# Patient Record
Sex: Female | Born: 1970
Health system: Southern US, Community
[De-identification: ages and names within clinical notes are randomized; demographics above are authoritative.]

## PROBLEM LIST (undated history)

## (undated) ENCOUNTER — Emergency Department (HOSPITAL_COMMUNITY): Discharge status: Absent without leave | Payer: BLUE CROSS/BLUE SHIELD

## (undated) HISTORY — PX: SINUS SURGERY WITH INSTATRAK: SHX5215

## (undated) HISTORY — PX: NASAL SINUS SURGERY: SHX719

## (undated) HISTORY — PX: BUNIONECTOMY: SHX129

## (undated) HISTORY — PX: LASIK: SHX215

## (undated) HISTORY — PX: APPENDECTOMY: SHX54

---

## 1998-04-29 ENCOUNTER — Other Ambulatory Visit: Admission: RE | Admit: 1998-04-29 | Discharge: 1998-04-29 | Payer: Self-pay | Admitting: Obstetrics and Gynecology

## 1999-06-27 ENCOUNTER — Other Ambulatory Visit: Admission: RE | Admit: 1999-06-27 | Discharge: 1999-06-27 | Payer: Self-pay | Admitting: Obstetrics and Gynecology

## 2000-09-19 ENCOUNTER — Other Ambulatory Visit: Admission: RE | Admit: 2000-09-19 | Discharge: 2000-09-19 | Payer: Self-pay | Admitting: Obstetrics and Gynecology

## 2001-12-18 ENCOUNTER — Other Ambulatory Visit: Admission: RE | Admit: 2001-12-18 | Discharge: 2001-12-18 | Payer: Self-pay | Admitting: Obstetrics and Gynecology

## 2002-09-01 ENCOUNTER — Encounter: Admission: RE | Admit: 2002-09-01 | Discharge: 2002-09-11 | Payer: Self-pay | Admitting: Family Medicine

## 2003-04-27 ENCOUNTER — Other Ambulatory Visit: Admission: RE | Admit: 2003-04-27 | Discharge: 2003-04-27 | Payer: Self-pay | Admitting: Obstetrics and Gynecology

## 2004-05-30 ENCOUNTER — Other Ambulatory Visit: Admission: RE | Admit: 2004-05-30 | Discharge: 2004-05-30 | Payer: Self-pay | Admitting: Obstetrics and Gynecology

## 2005-07-24 ENCOUNTER — Other Ambulatory Visit: Admission: RE | Admit: 2005-07-24 | Discharge: 2005-07-24 | Payer: Self-pay | Admitting: Obstetrics and Gynecology

## 2007-01-08 ENCOUNTER — Encounter: Admission: RE | Admit: 2007-01-08 | Discharge: 2007-01-08 | Payer: Self-pay | Admitting: Family Medicine

## 2013-08-17 ENCOUNTER — Other Ambulatory Visit: Payer: Self-pay | Admitting: Obstetrics and Gynecology

## 2013-08-17 DIAGNOSIS — R928 Other abnormal and inconclusive findings on diagnostic imaging of breast: Secondary | ICD-10-CM

## 2013-08-31 ENCOUNTER — Ambulatory Visit
Admission: RE | Admit: 2013-08-31 | Discharge: 2013-08-31 | Disposition: A | Discharge status: Home / Self Care | Payer: BC Managed Care – PPO | Source: Ambulatory Visit | Attending: Obstetrics and Gynecology | Admitting: Obstetrics and Gynecology

## 2013-08-31 DIAGNOSIS — R928 Other abnormal and inconclusive findings on diagnostic imaging of breast: Secondary | ICD-10-CM

## 2013-09-01 ENCOUNTER — Emergency Department
Admission: EM | Admit: 2013-09-01 | Discharge: 2013-09-01 | Disposition: A | Discharge status: Home / Self Care | Payer: BC Managed Care – PPO | Source: Home / Self Care | Attending: Family Medicine | Admitting: Family Medicine

## 2013-09-01 ENCOUNTER — Emergency Department (INDEPENDENT_AMBULATORY_CARE_PROVIDER_SITE_OTHER): Payer: BC Managed Care – PPO

## 2013-09-01 ENCOUNTER — Other Ambulatory Visit: Payer: BC Managed Care – HMO

## 2013-09-01 ENCOUNTER — Encounter: Payer: Self-pay | Admitting: Emergency Medicine

## 2013-09-01 DIAGNOSIS — S62619A Displaced fracture of proximal phalanx of unspecified finger, initial encounter for closed fracture: Secondary | ICD-10-CM

## 2013-09-01 DIAGNOSIS — IMO0002 Reserved for concepts with insufficient information to code with codable children: Secondary | ICD-10-CM

## 2013-09-01 DIAGNOSIS — S62639A Displaced fracture of distal phalanx of unspecified finger, initial encounter for closed fracture: Secondary | ICD-10-CM

## 2013-09-01 DIAGNOSIS — W230XXA Caught, crushed, jammed, or pinched between moving objects, initial encounter: Secondary | ICD-10-CM

## 2013-09-01 MED ORDER — TRAMADOL-ACETAMINOPHEN 37.5-325 MG PO TABS: 1.0000 | ORAL_TABLET | Freq: Four times a day (QID) | ORAL | Status: DC | PRN

## 2013-09-01 NOTE — ED Notes (Signed)
Pt c/o RT 4th finger injury x today, after getting it caught in her dogs collar.

## 2013-09-01 NOTE — ED Provider Notes (Signed)
CSN: [id number]     Arrival date & time 09/01/13  1859 History   First MD Initiated Contact with Patient 09/01/13 1905     Chief Complaint  Patient presents with  . Finger Injury    HPI  r 4th finger pain x 1 day  Pt got hier finger caught in her dog's collar.  Finger dislocated and pt reduced at time of incident.  Has had mild pain since this point.  Minimal to mild swelling Full ROM  Neurovascularly intact.   History reviewed. No pertinent past medical history. Past Surgical History  Procedure Laterality Date  . Appendectomy    . Nasal sinus surgery    . Bunionectomy    . Lasik     History reviewed. No pertinent family history. History  Substance Use Topics  . Smoking status: Never Smoker   . Smokeless tobacco: Not on file  . Alcohol Use: Yes   OB History   Grav Para Term Preterm Abortions TAB SAB Ect Mult Living                 Review of Systems  All other systems reviewed and are negative.    Allergies  Review of patient's allergies indicates no known allergies.  Home Medications   Current Outpatient Rx  Name  Route  Sig  Dispense  Refill  . Azelastine HCl (ASTEPRO) 0.15 % SOLN   Nasal   Place into the nose.         Marland Kitchen Fluticasone-Salmeterol (ADVAIR) 100-50 MCG/DOSE AEPB   Inhalation   Inhale 1 puff into the lungs every 12 (twelve) hours.         . traMADol-acetaminophen (ULTRACET) 37.5-325 MG per tablet   Oral   Take 1 tablet by mouth every 6 (six) hours as needed for pain.   30 tablet   0    BP 126/87  Pulse 78  Temp(Src) 98.2 F (36.8 C) (Oral)  Resp 16  Ht 5\' 4"  (1.626 m)  Wt 146 lb (66.225 kg)  BMI 25.05 kg/m2  SpO2 99%  LMP 08/29/2013 Physical Exam  Constitutional: She appears well-developed and well-nourished.  HENT:  Head: Normocephalic and atraumatic.  Eyes: Conjunctivae are normal. Pupils are equal, round, and reactive to light.  Neck: Normal range of motion. Neck supple.  Cardiovascular: Normal rate and regular rhythm.    Pulmonary/Chest: Effort normal and breath sounds normal.  Abdominal: Soft.  Musculoskeletal:       Hands: Neurological: She is alert.  Skin: Skin is warm.    ED Course  Procedures (including critical care time) Labs Review Labs Reviewed - No data to display Imaging Review Dg Finger Ring Right  09/01/2013   CLINICAL DATA:  Pain post trauma  EXAM: RIGHT 4TH FINGER 2+V  COMPARISON:  None.  FINDINGS: Frontal, oblique, and lateral views were obtained. There is a fracture along the medial distal aspect of the 4th proximal phalanx with medial displacement of the the fracture fragment compared to the remainder the bone. Fracture extends into the 4th PIP joint with soft tissue swelling in this area. No other fracture. No dislocation. The other joints appear normal.  IMPRESSION: Displaced fracture along the medial distal aspect of the 4th proximal phalanx.   Electronically Signed   By: Bretta Bang M.D.   On: 09/01/2013 19:35      MDM   1. Proximal phalanx fracture of finger, closed, initial encounter    Extended splint with buddy taped placed.  Ultracet for pain  control.  Discussed case with sports medicine over the phone.  Plan for follow up in am for general reeval Discussed general care and MSK red flags.  Follow up with sports medicine in am.     The patient and/or caregiver has been counseled thoroughly with regard to treatment plan and/or medications prescribed including dosage, schedule, interactions, rationale for use, and possible side effects and they verbalize understanding. Diagnoses and expected course of recovery discussed and will return if not improved as expected or if the condition worsens. Patient and/or caregiver verbalized understanding.         Doree Albee, MD 09/01/13 [phone]

## 2013-09-02 ENCOUNTER — Encounter: Payer: Self-pay | Admitting: Sports Medicine

## 2013-09-02 ENCOUNTER — Ambulatory Visit (INDEPENDENT_AMBULATORY_CARE_PROVIDER_SITE_OTHER): Payer: BC Managed Care – PPO | Admitting: Sports Medicine

## 2013-09-02 VITALS — BP 105/63 | HR 68 | Wt 146.0 lb

## 2013-09-02 DIAGNOSIS — S62614A Displaced fracture of proximal phalanx of right ring finger, initial encounter for closed fracture: Secondary | ICD-10-CM | POA: Insufficient documentation

## 2013-09-02 DIAGNOSIS — IMO0002 Reserved for concepts with insufficient information to code with codable children: Secondary | ICD-10-CM

## 2013-09-02 DIAGNOSIS — W540XXA Bitten by dog, initial encounter: Secondary | ICD-10-CM

## 2013-09-02 NOTE — Assessment & Plan Note (Addendum)
Fracture extends into the PIP joint. We did discuss the risk of future osteoarthritis. Splint replaced. Tylenol is all she has needed for pain, calcium and vitamin D supplement twice a day. Return in one week to repeat x-rays.  I billed a fracture code for this visit, all subsequent visits for this complaint will be "post-op checks" in the global period.

## 2013-09-02 NOTE — Progress Notes (Addendum)
   Subjective:    I'm seeing this patient as a consultation for:  Dr. Alvester Morin  CC: Finger fracture  HPI: This is a very pleasant 49 female, unfortunately she was walking her dog yesterday when it wrapped the leash around her finger, ran, snapping her finger. She tells me that it appeared deformed, and she self reduced the fracture. She went to urgent care where x-rays were done that showed a fracture through the distal proximal phalanx of the fourth ring finger of the right hand. She was placed in a static splint and referred to me for further evaluation and definitive treatment. Pain is only mild, and she really hasn't had to use anything other than Tylenol. Localized, persistent.  Past medical history, Surgical history, Family history not pertinant except as noted below, Social history, Allergies, and medications have been entered into the medical record, reviewed, and no changes needed.   Review of Systems: No headache, visual changes, nausea, vomiting, diarrhea, constipation, dizziness, abdominal pain, skin rash, fevers, chills, night sweats, weight loss, swollen lymph nodes, body aches, joint swelling, muscle aches, chest pain, shortness of breath, mood changes, visual or auditory hallucinations.   Objective:   General: Well Developed, well nourished, and in no acute distress.  Neuro/Psych: Alert and oriented x3, extra-ocular muscles intact, able to move all 4 extremities, sensation grossly intact. Skin: Warm and dry, no rashes noted.  Respiratory: Not using accessory muscles, speaking in full sentences, trachea midline.  Cardiovascular: Pulses palpable, no extremity edema. Abdomen: Does not appear distended. Right hand: Splint is removed, there is some swelling and bruising with tenderness to palpation over the distal fourth proximal phalanx, she has excellent motion with full flexion and extension, there is some scissoring of the fifth digit under the fourth digit but this is similar on  the contralateral side. She is neurovascularly intact distally.  X-rays were personally reviewed, there is a fracture through the medial distal condyle of the right fourth proximal phalanx. This fracture extends into the proximal interphalangeal joint, but does not create much joint incongruity.  I recreated the static extension splint.  Impression and Recommendations:   This case required medical decision making of moderate complexity.

## 2013-09-08 ENCOUNTER — Ambulatory Visit: Admission: RE | Admit: 2013-09-08 | Payer: BC Managed Care – PPO | Source: Ambulatory Visit

## 2013-09-08 ENCOUNTER — Ambulatory Visit
Admission: RE | Admit: 2013-09-08 | Discharge: 2013-09-08 | Disposition: A | Discharge status: Home / Self Care | Payer: BC Managed Care – PPO | Source: Ambulatory Visit | Attending: Obstetrics and Gynecology | Admitting: Obstetrics and Gynecology

## 2013-09-08 IMAGING — MG MM DIAGNOSTIC UNILATERAL R
2 series · 2 of 2 positions shown · non-contrast
Comparison: [DATE], [DATE], [DATE], [DATE] from
[HOSPITAL] OB/GYN

CLINICAL DATA: The patient returns for evaluation of a possible
area of architectural distortion in the outer portion of the right
breast noted on screening study from [HOSPITAL] OB/GYN dated
[DATE].

DIGITAL DIAGNOSTIC RIGHT MAMMOGRAM

[R CC]
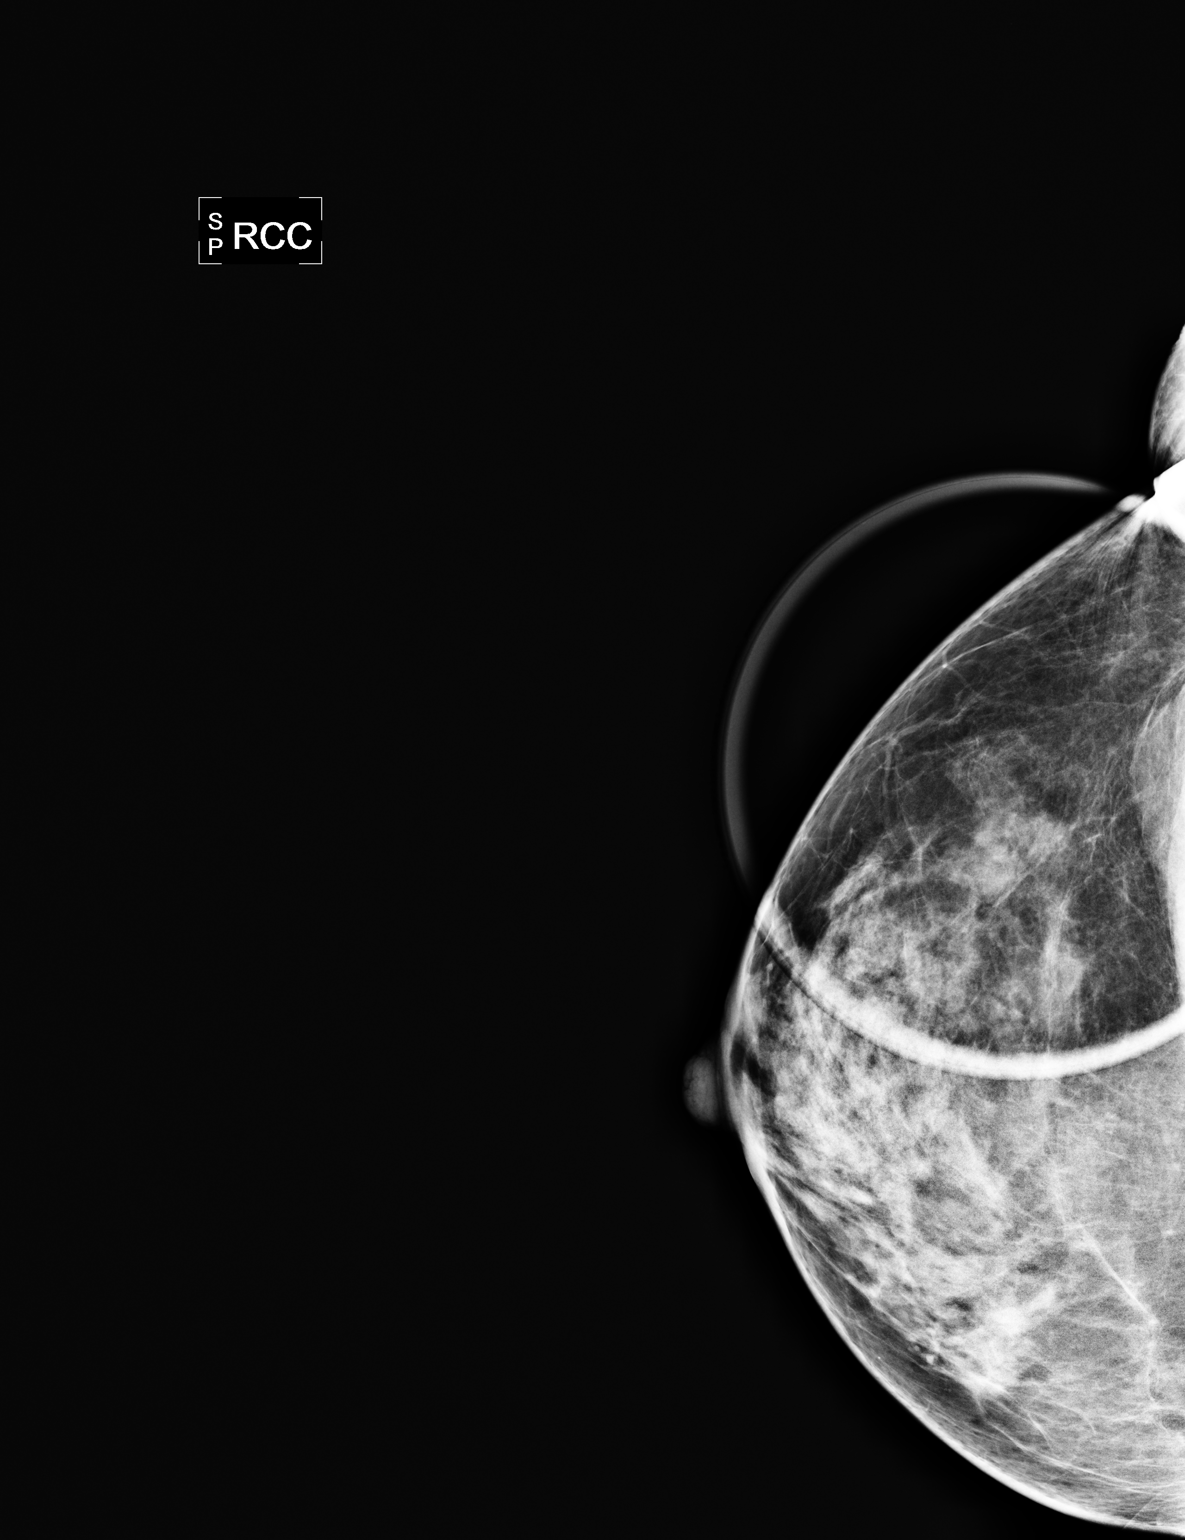

[R MLO]
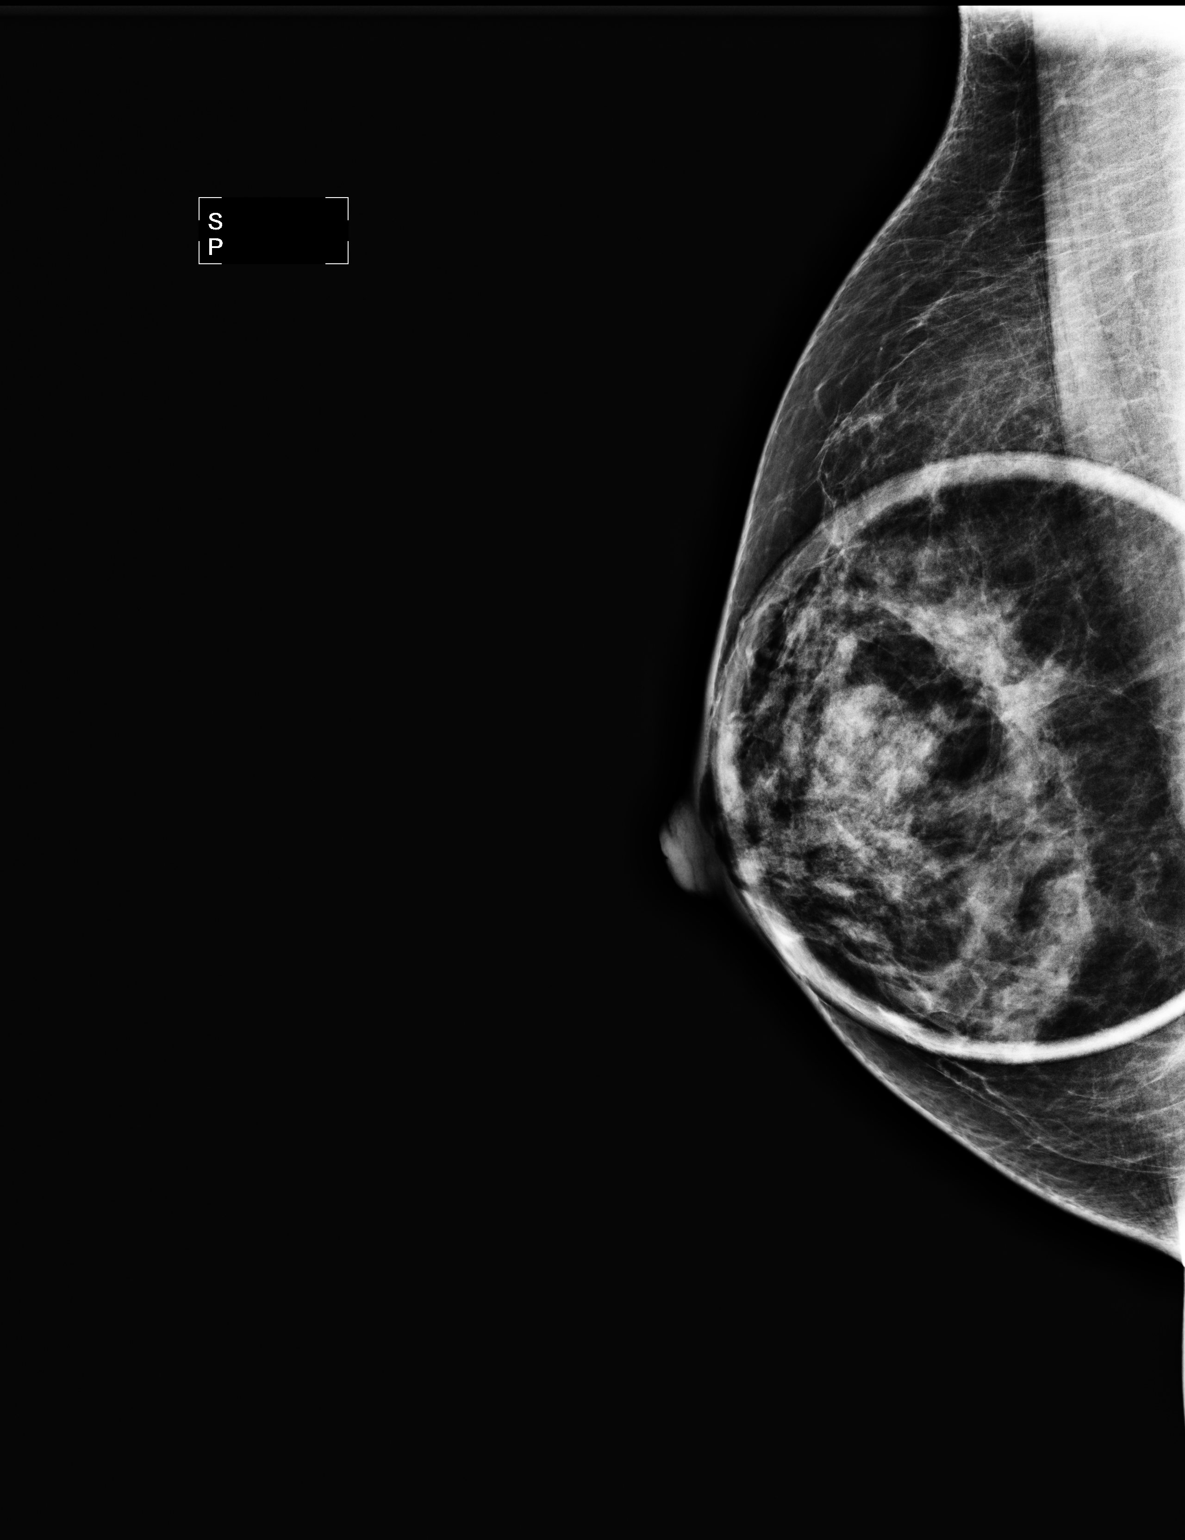

[2 of 2 positions shown; findings below may reference images not displayed]

FINDINGS: ACR Breast Density Category c:  The breast tissue is
heterogeneously dense, which may obscure small masses.

Additional views demonstrate no persistent worrisome mass or
distortion in the outer portion of the right breast.
IMPRESSION: No persistent worrisome abnormality in the outer portion of the
right breast.

RECOMMENDATION:
Yearly screening mammography is suggested.

I have discussed the findings and recommendations with the patient.
Results were also provided in writing at the conclusion of the
visit.  If applicable, a reminder letter will be sent to the
patient regarding her next appointment.

BI-RADS CATEGORY 1:  Negative.

## 2013-09-09 ENCOUNTER — Ambulatory Visit (INDEPENDENT_AMBULATORY_CARE_PROVIDER_SITE_OTHER): Payer: BC Managed Care – PPO

## 2013-09-09 ENCOUNTER — Encounter: Payer: Self-pay | Admitting: Sports Medicine

## 2013-09-09 ENCOUNTER — Ambulatory Visit (INDEPENDENT_AMBULATORY_CARE_PROVIDER_SITE_OTHER): Payer: BC Managed Care – PPO | Admitting: Sports Medicine

## 2013-09-09 VITALS — BP 120/79 | HR 69 | Wt 147.0 lb

## 2013-09-09 DIAGNOSIS — IMO0001 Reserved for inherently not codable concepts without codable children: Secondary | ICD-10-CM

## 2013-09-09 DIAGNOSIS — S62614A Displaced fracture of proximal phalanx of right ring finger, initial encounter for closed fracture: Secondary | ICD-10-CM

## 2013-09-09 DIAGNOSIS — S62614D Displaced fracture of proximal phalanx of right ring finger, subsequent encounter for fracture with routine healing: Secondary | ICD-10-CM

## 2013-09-09 NOTE — Assessment & Plan Note (Signed)
Doing well. Return in 2 weeks, we will probably get her out of the splint and and to buddy taping at that time, and working on her range of motion.

## 2013-09-09 NOTE — Progress Notes (Signed)
  Subjective: One week status post fracture of the distal end of the right fourth proximal phalanx. Doing well, very little pain.   Objective: General: Well-developed, well-nourished, and in no acute distress. Splint is removed, she has good strength to flexion and extension at the metacarpophalangeal, PIP, and DIP joints.  Static splint reapplied.  X-rays look good.  Assessment/plan:

## 2013-09-23 ENCOUNTER — Encounter: Payer: Self-pay | Admitting: Sports Medicine

## 2013-09-23 ENCOUNTER — Ambulatory Visit (INDEPENDENT_AMBULATORY_CARE_PROVIDER_SITE_OTHER): Payer: BC Managed Care – PPO | Admitting: Sports Medicine

## 2013-09-23 VITALS — BP 107/67 | HR 84 | Wt 148.0 lb

## 2013-09-23 DIAGNOSIS — S62614D Displaced fracture of proximal phalanx of right ring finger, subsequent encounter for fracture with routine healing: Secondary | ICD-10-CM

## 2013-09-23 DIAGNOSIS — IMO0002 Reserved for concepts with insufficient information to code with codable children: Secondary | ICD-10-CM

## 2013-09-23 NOTE — Assessment & Plan Note (Signed)
Out of static splint, third and fourth fingers were buddy taped together, and I have recommended and encouraged passive range of motion.

## 2013-09-23 NOTE — Progress Notes (Signed)
  Subjective: Three-week status post fracture of the right fourth proximal phalanx. Doing well with static splint.   Objective: General: Well-developed, well-nourished, and in no acute distress. Splint is removed, there is still tenderness to palpation over the fracture site, she does have only approximately 20 of range of motion. Neurovascularly intact distally.  Fingers were buddy taped together.  Assessment/plan:

## 2013-09-28 ENCOUNTER — Encounter: Payer: Self-pay | Admitting: Sports Medicine

## 2013-09-28 ENCOUNTER — Ambulatory Visit (INDEPENDENT_AMBULATORY_CARE_PROVIDER_SITE_OTHER): Payer: BC Managed Care – PPO | Admitting: Sports Medicine

## 2013-09-28 ENCOUNTER — Ambulatory Visit (INDEPENDENT_AMBULATORY_CARE_PROVIDER_SITE_OTHER): Payer: BC Managed Care – PPO

## 2013-09-28 VITALS — BP 111/70 | HR 84 | Wt 149.0 lb

## 2013-09-28 DIAGNOSIS — S62614G Displaced fracture of proximal phalanx of right ring finger, subsequent encounter for fracture with delayed healing: Secondary | ICD-10-CM

## 2013-09-28 DIAGNOSIS — IMO0001 Reserved for inherently not codable concepts without codable children: Secondary | ICD-10-CM

## 2013-09-28 DIAGNOSIS — IMO0002 Reserved for concepts with insufficient information to code with codable children: Secondary | ICD-10-CM

## 2013-09-28 MED ORDER — HYDROCODONE-ACETAMINOPHEN 5-325 MG PO TABS: 1.0000 | ORAL_TABLET | Freq: Three times a day (TID) | ORAL | Status: DC | PRN

## 2013-09-28 NOTE — Progress Notes (Addendum)
  Subjective: This pleasant 42 year old female is now 4 weeks outside of her fracture of her right fourth proximal phalanx. She was doing very well and we have transitioned her into gentle range of motion exercises unfortunately she bumped her finger into the gearshift run her car. She had immediate pain, swelling.   Objective: General: Well-developed, well-nourished, and in no acute distress. Finger does appear swollen, very tender to palpation at the fracture site, I am unable to fully extend the finger and she lacks approximately 5 of extension but she does have active extension and flexion strength, collaterals are all stable.  X-rays were reviewed and does not show significant change in the position of the fracture, there is some sign of healing.  Fingers were buddy taped together. Assessment/plan:

## 2013-09-28 NOTE — Assessment & Plan Note (Signed)
Was doing well, unfortunately banged her finger against a gearshift. Now is unable to fully extend at the proximal interphalangeal joint, and has worse swelling and pain. I am concerned for developing boutonniere's deformity. Fingers were buddy taped again, and I'm going to re-x-ray them.

## 2013-10-05 ENCOUNTER — Encounter: Payer: Self-pay | Admitting: Sports Medicine

## 2013-10-05 ENCOUNTER — Ambulatory Visit (INDEPENDENT_AMBULATORY_CARE_PROVIDER_SITE_OTHER): Payer: BC Managed Care – PPO | Admitting: Sports Medicine

## 2013-10-05 VITALS — BP 117/78 | HR 86 | Wt 148.0 lb

## 2013-10-05 DIAGNOSIS — IMO0001 Reserved for inherently not codable concepts without codable children: Secondary | ICD-10-CM

## 2013-10-05 DIAGNOSIS — S62614G Displaced fracture of proximal phalanx of right ring finger, subsequent encounter for fracture with delayed healing: Secondary | ICD-10-CM

## 2013-10-05 NOTE — Progress Notes (Signed)
  Subjective: Bonnie Yoder is now approximately 4 weeks after fracture of the distal aspect of her proximal phalanx on the right ring finger. She was doing well until we got her out of immobilization, she then bumped her finger against the gear shifter of her car, and was unable to straighten her finger route after that at the PIP joint. I saw her last week, obtained x-rays which were essentially unchanged, and place her at further remobilization. She returns today still unable to fully extend her right ring finger at the proximal interphalangeal joint.   Objective: General: Well-developed, well-nourished, and in no acute distress. Right ring finger is still slightly swollen, unable to completely extend either passively or actively at the proximal interphalangeal joint. Collateral ligaments are stable.  Assessment/plan:

## 2013-10-05 NOTE — Assessment & Plan Note (Signed)
This was a proceding very well for the first 3 weeks after fracture, unfortunately there was a reinjury, and now at week 4, I am unable to fully extend the finger at the proximal interphalangeal joint. This does raise the concern for early central slip injury with boutonniere deformity. At this point I would like her to see Dr. Janee Morn with hand surgery.

## 2013-10-07 ENCOUNTER — Telehealth: Payer: Self-pay

## 2013-10-07 NOTE — Telephone Encounter (Signed)
Error

## 2013-10-14 ENCOUNTER — Ambulatory Visit: Payer: BC Managed Care – PPO | Admitting: Sports Medicine

## 2015-06-14 ENCOUNTER — Ambulatory Visit
Admission: RE | Admit: 2015-06-14 | Discharge: 2015-06-14 | Disposition: A | Discharge status: Home / Self Care | Payer: BLUE CROSS/BLUE SHIELD | Source: Ambulatory Visit | Attending: Family Medicine | Admitting: Family Medicine

## 2015-06-14 ENCOUNTER — Other Ambulatory Visit: Payer: Self-pay | Admitting: Family Medicine

## 2015-06-14 DIAGNOSIS — M542 Cervicalgia: Secondary | ICD-10-CM

## 2015-11-29 ENCOUNTER — Other Ambulatory Visit: Payer: Self-pay | Admitting: Family Medicine

## 2015-11-29 ENCOUNTER — Ambulatory Visit
Admission: RE | Admit: 2015-11-29 | Discharge: 2015-11-29 | Disposition: A | Discharge status: Home / Self Care | Payer: BLUE CROSS/BLUE SHIELD | Source: Ambulatory Visit | Attending: Family Medicine | Admitting: Family Medicine

## 2015-11-29 DIAGNOSIS — T1490XA Injury, unspecified, initial encounter: Secondary | ICD-10-CM

## 2016-06-11 DIAGNOSIS — K529 Noninfective gastroenteritis and colitis, unspecified: Secondary | ICD-10-CM | POA: Diagnosis not present

## 2016-06-12 DIAGNOSIS — K529 Noninfective gastroenteritis and colitis, unspecified: Secondary | ICD-10-CM | POA: Diagnosis not present

## 2016-06-21 DIAGNOSIS — L719 Rosacea, unspecified: Secondary | ICD-10-CM | POA: Diagnosis not present

## 2016-06-21 DIAGNOSIS — J309 Allergic rhinitis, unspecified: Secondary | ICD-10-CM | POA: Diagnosis not present

## 2016-06-21 DIAGNOSIS — R152 Fecal urgency: Secondary | ICD-10-CM | POA: Diagnosis not present

## 2016-06-21 DIAGNOSIS — J45909 Unspecified asthma, uncomplicated: Secondary | ICD-10-CM | POA: Diagnosis not present

## 2016-06-21 DIAGNOSIS — Z0000 Encounter for general adult medical examination without abnormal findings: Secondary | ICD-10-CM | POA: Diagnosis not present

## 2016-06-22 DIAGNOSIS — R197 Diarrhea, unspecified: Secondary | ICD-10-CM | POA: Diagnosis not present

## 2016-06-22 DIAGNOSIS — R109 Unspecified abdominal pain: Secondary | ICD-10-CM | POA: Diagnosis not present

## 2016-06-22 DIAGNOSIS — Z800 Family history of malignant neoplasm of digestive organs: Secondary | ICD-10-CM | POA: Diagnosis not present

## 2016-06-27 DIAGNOSIS — R197 Diarrhea, unspecified: Secondary | ICD-10-CM | POA: Diagnosis not present

## 2016-08-31 DIAGNOSIS — Z23 Encounter for immunization: Secondary | ICD-10-CM | POA: Diagnosis not present

## 2016-11-18 ENCOUNTER — Encounter: Payer: Self-pay | Admitting: Emergency Medicine

## 2016-11-18 ENCOUNTER — Emergency Department
Admission: EM | Admit: 2016-11-18 | Discharge: 2016-11-18 | Disposition: A | Discharge status: Home / Self Care | Payer: BLUE CROSS/BLUE SHIELD | Source: Home / Self Care | Attending: Family Medicine | Admitting: Family Medicine

## 2016-11-18 DIAGNOSIS — R1032 Left lower quadrant pain: Secondary | ICD-10-CM

## 2016-11-18 DIAGNOSIS — R112 Nausea with vomiting, unspecified: Secondary | ICD-10-CM

## 2016-11-18 MED ORDER — METRONIDAZOLE 500 MG PO TABS: 500.0000 mg | ORAL_TABLET | Freq: Three times a day (TID) | ORAL | 0 refills | Status: DC

## 2016-11-18 MED ORDER — PROMETHAZINE HCL 25 MG PO TABS: 25.0000 mg | ORAL_TABLET | Freq: Four times a day (QID) | ORAL | 0 refills | Status: DC | PRN

## 2016-11-18 MED ORDER — CIPROFLOXACIN HCL 500 MG PO TABS
500.0000 mg | ORAL_TABLET | Freq: Two times a day (BID) | ORAL | 0 refills | Status: DC
Start: 2016-11-18 — End: 2019-12-31

## 2016-11-18 NOTE — ED Triage Notes (Signed)
Pt c/o left lower quadrant pain, low grade fever, nausea and vomitting.  Hadn't really eaten anything out the norm.

## 2016-11-18 NOTE — ED Provider Notes (Signed)
CSN: [id number]     Arrival date & time 11/18/16  1214 History   First MD Initiated Contact with Patient 11/18/16 1315     Chief Complaint  Patient presents with  . Abdominal Pain   (Consider location/radiation/quality/duration/timing/severity/associated sxs/prior Treatment) HPI  Bonnie Yoder is a 46 y.o. female presenting to UC with c/o LLQ abdominal pain with low grade fever, nausea and vomiting that started this morning.  She reports mild bloating yesterday. No diarrhea. No sick contacts. Denies eating anything that may have caused symptoms. Denies hx of known diverticulitis.  Denies urinary symptoms. No medications tried PTA.  Hx of appendectomy.  History reviewed. No pertinent past medical history. Past Surgical History:  Procedure Laterality Date  . APPENDECTOMY    . BUNIONECTOMY    . BUNIONECTOMY    . LASIK    . NASAL SINUS SURGERY    . SINUS SURGERY WITH INSTATRAK     Family History  Problem Relation Age of Onset  . Cancer Mother   . Heart disease Father    Social History  Substance Use Topics  . Smoking status: Never Smoker  . Smokeless tobacco: Never Used  . Alcohol use Yes   OB History    No data available     Review of Systems  Constitutional: Positive for fever. Negative for appetite change and chills.  Respiratory: Negative for cough, chest tightness and shortness of breath.   Cardiovascular: Negative for chest pain and palpitations.  Gastrointestinal: Positive for abdominal pain ( LLQ), nausea and vomiting. Negative for blood in stool and diarrhea.  Genitourinary: Negative for dysuria, flank pain, frequency, pelvic pain, urgency, vaginal bleeding and vaginal discharge.  Musculoskeletal: Negative for back pain and myalgias.    Allergies  Patient has no known allergies.  Home Medications   Prior to Admission medications   Medication Sig Start Date End Date Taking? Authorizing Provider  Ibuprofen (ADVIL PO) Take by mouth.   Yes Historical Provider,  MD  Azelastine HCl (ASTEPRO) 0.15 % SOLN Place into the nose.    Historical Provider, MD  ciprofloxacin (CIPRO) 500 MG tablet Take 1 tablet (500 mg total) by mouth 2 (two) times daily. x 7 days 11/18/16   Junius FinnerErin O'Malley, PA-C  Fluticasone-Salmeterol (ADVAIR) 100-50 MCG/DOSE AEPB Inhale 1 puff into the lungs every 12 (twelve) hours.    Historical Provider, MD  metroNIDAZOLE (FLAGYL) 500 MG tablet Take 1 tablet (500 mg total) by mouth 3 (three) times daily. One po bid x 7 days 11/18/16   Junius FinnerErin O'Malley, PA-C  promethazine (PHENERGAN) 25 MG tablet Take 1 tablet (25 mg total) by mouth every 6 (six) hours as needed for nausea or vomiting. 11/18/16   Junius FinnerErin O'Malley, PA-C   Meds Ordered and Administered this Visit  Medications - No data to display  BP 112/76 (BP Location: Left Arm)   Pulse 85   Temp 98.8 F (37.1 C) (Oral)   Ht 5\' 4"  (1.626 m)   Wt 158 lb 8 oz (71.9 kg)   LMP 10/19/2016 (Exact Date)   SpO2 100%   BMI 27.21 kg/m  No data found.   Physical Exam  Constitutional: She is oriented to person, place, and time. She appears well-developed and well-nourished. No distress.  HENT:  Head: Normocephalic and atraumatic.  Mouth/Throat: Oropharynx is clear and moist.  Eyes: EOM are normal.  Neck: Normal range of motion.  Cardiovascular: Normal rate and regular rhythm.   Pulmonary/Chest: Effort normal and breath sounds normal. No respiratory distress. She has  no wheezes. She has no rales.  Abdominal: Soft. She exhibits no distension and no mass. There is tenderness in the left lower quadrant. There is no rigidity, no rebound, no guarding and no CVA tenderness.  Musculoskeletal: Normal range of motion.  Neurological: She is alert and oriented to person, place, and time.  Skin: Skin is warm and dry. She is not diaphoretic.  Psychiatric: She has a normal mood and affect. Her behavior is normal.  Nursing note and vitals reviewed.   Urgent Care Course   Clinical Course     Procedures  (including critical care time)  Labs Review Labs Reviewed - No data to display  Imaging Review No results found.   MDM   1. Abdominal pain, acute, left lower quadrant   2. Non-intractable vomiting with nausea, unspecified vomiting type    Low grade fever, nausea, vomiting and LLQ abdominal pain with mild tenderness on exam.  Exam not concerning for surgical abdomen. Clinical concern for diverticulitis. Advised pt cannot confirm diagnosis w/o CT scan, would have to send pt to [facility].  Pt appears well, afebrile at this time, able to keep down fluids, offered empiric treatment. Pt agreeable to try empiric treatment first.  Strongly advised to call PCP this week for recheck of symptoms and for likely colonoscopy f/u.  Rx: Cipro, flagyl, phenergan Discussed symptoms that warrant emergent care in the ED. Patient verbalized understanding and agreement with treatment plan.    Junius Finner, PA-C 11/18/16 1443

## 2016-11-23 ENCOUNTER — Telehealth: Payer: Self-pay | Admitting: Emergency Medicine

## 2016-11-23 NOTE — Telephone Encounter (Signed)
Inquired about patient's status; encourage them to call with questions/concerns.  

## 2016-12-06 DIAGNOSIS — Z124 Encounter for screening for malignant neoplasm of cervix: Secondary | ICD-10-CM | POA: Diagnosis not present

## 2016-12-06 DIAGNOSIS — Z6826 Body mass index (BMI) 26.0-26.9, adult: Secondary | ICD-10-CM | POA: Diagnosis not present

## 2016-12-06 DIAGNOSIS — Z01419 Encounter for gynecological examination (general) (routine) without abnormal findings: Secondary | ICD-10-CM | POA: Diagnosis not present

## 2016-12-06 DIAGNOSIS — Z1231 Encounter for screening mammogram for malignant neoplasm of breast: Secondary | ICD-10-CM | POA: Diagnosis not present

## 2016-12-07 ENCOUNTER — Other Ambulatory Visit: Payer: Self-pay | Admitting: Obstetrics

## 2016-12-07 DIAGNOSIS — R928 Other abnormal and inconclusive findings on diagnostic imaging of breast: Secondary | ICD-10-CM

## 2016-12-12 ENCOUNTER — Ambulatory Visit
Admission: RE | Admit: 2016-12-12 | Discharge: 2016-12-12 | Disposition: A | Discharge status: Home / Self Care | Payer: BLUE CROSS/BLUE SHIELD | Source: Ambulatory Visit | Attending: Obstetrics | Admitting: Obstetrics

## 2016-12-12 DIAGNOSIS — N6489 Other specified disorders of breast: Secondary | ICD-10-CM | POA: Diagnosis not present

## 2016-12-12 DIAGNOSIS — R928 Other abnormal and inconclusive findings on diagnostic imaging of breast: Secondary | ICD-10-CM

## 2016-12-12 DIAGNOSIS — R922 Inconclusive mammogram: Secondary | ICD-10-CM | POA: Diagnosis not present

## 2017-08-15 DIAGNOSIS — Z13220 Encounter for screening for lipoid disorders: Secondary | ICD-10-CM | POA: Diagnosis not present

## 2017-08-15 DIAGNOSIS — Z131 Encounter for screening for diabetes mellitus: Secondary | ICD-10-CM | POA: Diagnosis not present

## 2017-08-15 DIAGNOSIS — Z0000 Encounter for general adult medical examination without abnormal findings: Secondary | ICD-10-CM | POA: Diagnosis not present

## 2017-12-11 DIAGNOSIS — N898 Other specified noninflammatory disorders of vagina: Secondary | ICD-10-CM | POA: Diagnosis not present

## 2017-12-11 DIAGNOSIS — Z1231 Encounter for screening mammogram for malignant neoplasm of breast: Secondary | ICD-10-CM | POA: Diagnosis not present

## 2017-12-11 DIAGNOSIS — Z01419 Encounter for gynecological examination (general) (routine) without abnormal findings: Secondary | ICD-10-CM | POA: Diagnosis not present

## 2017-12-11 DIAGNOSIS — Z6826 Body mass index (BMI) 26.0-26.9, adult: Secondary | ICD-10-CM | POA: Diagnosis not present

## 2017-12-12 ENCOUNTER — Other Ambulatory Visit: Payer: Self-pay | Admitting: Obstetrics

## 2017-12-12 DIAGNOSIS — R928 Other abnormal and inconclusive findings on diagnostic imaging of breast: Secondary | ICD-10-CM

## 2017-12-19 ENCOUNTER — Ambulatory Visit: Payer: BLUE CROSS/BLUE SHIELD

## 2017-12-19 ENCOUNTER — Ambulatory Visit
Admission: RE | Admit: 2017-12-19 | Discharge: 2017-12-19 | Disposition: A | Discharge status: Home / Self Care | Payer: BLUE CROSS/BLUE SHIELD | Source: Ambulatory Visit | Attending: Obstetrics | Admitting: Obstetrics

## 2017-12-19 DIAGNOSIS — R922 Inconclusive mammogram: Secondary | ICD-10-CM | POA: Diagnosis not present

## 2017-12-19 DIAGNOSIS — R928 Other abnormal and inconclusive findings on diagnostic imaging of breast: Secondary | ICD-10-CM

## 2017-12-27 DIAGNOSIS — Z30431 Encounter for routine checking of intrauterine contraceptive device: Secondary | ICD-10-CM | POA: Diagnosis not present

## 2017-12-27 DIAGNOSIS — Z6826 Body mass index (BMI) 26.0-26.9, adult: Secondary | ICD-10-CM | POA: Diagnosis not present

## 2018-04-01 DIAGNOSIS — H52223 Regular astigmatism, bilateral: Secondary | ICD-10-CM | POA: Diagnosis not present

## 2018-04-01 DIAGNOSIS — H524 Presbyopia: Secondary | ICD-10-CM | POA: Diagnosis not present

## 2018-09-26 DIAGNOSIS — E7800 Pure hypercholesterolemia, unspecified: Secondary | ICD-10-CM | POA: Diagnosis not present

## 2018-09-26 DIAGNOSIS — Z131 Encounter for screening for diabetes mellitus: Secondary | ICD-10-CM | POA: Diagnosis not present

## 2018-09-26 DIAGNOSIS — Z0000 Encounter for general adult medical examination without abnormal findings: Secondary | ICD-10-CM | POA: Diagnosis not present

## 2019-01-06 DIAGNOSIS — Z01419 Encounter for gynecological examination (general) (routine) without abnormal findings: Secondary | ICD-10-CM | POA: Diagnosis not present

## 2019-01-06 DIAGNOSIS — Z6823 Body mass index (BMI) 23.0-23.9, adult: Secondary | ICD-10-CM | POA: Diagnosis not present

## 2019-01-06 DIAGNOSIS — Z1231 Encounter for screening mammogram for malignant neoplasm of breast: Secondary | ICD-10-CM | POA: Diagnosis not present

## 2019-05-04 DIAGNOSIS — Z3202 Encounter for pregnancy test, result negative: Secondary | ICD-10-CM | POA: Diagnosis not present

## 2019-05-04 DIAGNOSIS — Z6824 Body mass index (BMI) 24.0-24.9, adult: Secondary | ICD-10-CM | POA: Diagnosis not present

## 2019-05-04 DIAGNOSIS — Z30430 Encounter for insertion of intrauterine contraceptive device: Secondary | ICD-10-CM | POA: Diagnosis not present

## 2019-05-04 DIAGNOSIS — Z30433 Encounter for removal and reinsertion of intrauterine contraceptive device: Secondary | ICD-10-CM | POA: Diagnosis not present

## 2019-10-21 DIAGNOSIS — Z0000 Encounter for general adult medical examination without abnormal findings: Secondary | ICD-10-CM | POA: Diagnosis not present

## 2019-12-31 ENCOUNTER — Other Ambulatory Visit: Payer: Self-pay

## 2019-12-31 ENCOUNTER — Ambulatory Visit (INDEPENDENT_AMBULATORY_CARE_PROVIDER_SITE_OTHER): Payer: BC Managed Care – PPO

## 2019-12-31 ENCOUNTER — Encounter: Payer: Self-pay | Admitting: Podiatry

## 2019-12-31 ENCOUNTER — Ambulatory Visit: Payer: BC Managed Care – PPO | Admitting: Podiatry

## 2019-12-31 VITALS — BP 110/72 | HR 73 | Temp 98.6°F

## 2019-12-31 DIAGNOSIS — M778 Other enthesopathies, not elsewhere classified: Secondary | ICD-10-CM

## 2019-12-31 MED ORDER — METHYLPREDNISOLONE 4 MG PO TBPK: ORAL_TABLET | ORAL | 0 refills | Status: AC

## 2019-12-31 MED ORDER — MELOXICAM 15 MG PO TABS: 15.0000 mg | ORAL_TABLET | Freq: Every day | ORAL | 3 refills | Status: AC

## 2019-12-31 NOTE — Progress Notes (Signed)
  Subjective:  Patient ID: Bonnie Yoder, female    DOB: 1971-02-26,  MRN: [id number] HPI Chief Complaint  Patient presents with  . Foot Pain    Plantar forefoot bilateral (R>L) - aching x 6 months, active in dance and certain movements aggravate, tried aleve  . New Patient (Initial Visit)    49 y.o. female presents with the above complaint.    we should be in a regular shoe.  ROS: Denies fever chills nausea vomiting muscle aches pains calf pain back pain chest pain shortness of breath.  No past medical history on file. Past Surgical History:  Procedure Laterality Date  . APPENDECTOMY    . BUNIONECTOMY    . BUNIONECTOMY    . LASIK    . NASAL SINUS SURGERY    . SINUS SURGERY WITH INSTATRAK      Current Outpatient Medications:  .  fexofenadine (ALLEGRA) 60 MG tablet, Take 60 mg by mouth 2 (two) times daily., Disp: , Rfl:  .  Fluticasone-Salmeterol (ADVAIR DISKUS IN), Inhale into the lungs., Disp: , Rfl:  .  Azelastine HCl (ASTEPRO) 0.15 % SOLN, Place into the nose., Disp: , Rfl:  .  estradiol (VIVELLE-DOT) 0.1 MG/24HR patch, 1 patch 2 (two) times a week., Disp: , Rfl:  .  levonorgestrel (MIRENA, 52 MG,) 20 MCG/24HR IUD, Mirena 20 mcg/24 hours (6 yrs) 52 mg intrauterine device  Take 1 device by intrauterine route., Disp: , Rfl:  .  meloxicam (MOBIC) 15 MG tablet, Take 1 tablet (15 mg total) by mouth daily., Disp: 30 tablet, Rfl: 3 .  methylPREDNISolone (MEDROL DOSEPAK) 4 MG TBPK tablet, 6 day dose pack - take as directed, Disp: 21 tablet, Rfl: 0  Allergies  Allergen Reactions  . Amoxicillin-Pot Clavulanate    Review of Systems Objective:   Vitals:   12/31/19 0832  BP: 110/72  Pulse: 73  Temp: 98.6 F (37 C)    General: Well developed, nourished, in no acute distress, alert and oriented x3   Dermatological: Skin is warm, dry and supple bilateral. Nails x 10 are well maintained; remaining integument appears unremarkable at this time. There are no open sores, no  preulcerative lesions, no rash or signs of infection present.  Vascular: Dorsalis Pedis artery and Posterior Tibial artery pedal pulses are 2/4 bilateral with immedate capillary fill time. Pedal hair growth present. No varicosities and no lower extremity edema present bilateral.   Neruologic: Grossly intact via light touch bilateral. Vibratory intact via tuning fork bilateral. Protective threshold with Semmes Wienstein monofilament intact to all pedal sites bilateral. Patellar and Achilles deep tendon reflexes 2+ bilateral. No Babinski or clonus noted bilateral.   Musculoskeletal: No gross boney pedal deformities bilateral. No pain, crepitus, or limitation noted with foot and ankle range of motion bilateral. Muscular strength 5/5 in all groups tested bilateral.  She has mild tenderness on palpation and range of motion of the second metatarsophalangeal joints.  Gait: Unassisted, Nonantalgic.    Radiographs:  Radiographs taken today do not demonstrate any type of osseous abnormalities.  Demonstrates an osseously mature individual with elongated second metatarsal with a history of bunion repair.  Assessment & Plan:   Assessment: Capsulitis second metatarsophalangeal joints.    Plan: Offered her an injection to the second metatarsophalangeal joint and she refused.  At this point started her on methylprednisolone to be followed by meloxicam.  Also discussed appropriate shoe gear.  Follow-up with her in 1 month to 6 weeks.     Kolden Dupee T. [place], [place]

## 2020-02-09 ENCOUNTER — Ambulatory Visit: Payer: BC Managed Care – PPO | Admitting: Podiatry

## 2020-02-10 DIAGNOSIS — Z6827 Body mass index (BMI) 27.0-27.9, adult: Secondary | ICD-10-CM | POA: Diagnosis not present

## 2020-02-10 DIAGNOSIS — Z01419 Encounter for gynecological examination (general) (routine) without abnormal findings: Secondary | ICD-10-CM | POA: Diagnosis not present

## 2020-02-10 DIAGNOSIS — Z1231 Encounter for screening mammogram for malignant neoplasm of breast: Secondary | ICD-10-CM | POA: Diagnosis not present

## 2020-06-02 DIAGNOSIS — Z1211 Encounter for screening for malignant neoplasm of colon: Secondary | ICD-10-CM | POA: Diagnosis not present

## 2020-06-02 DIAGNOSIS — Z86010 Personal history of colonic polyps: Secondary | ICD-10-CM | POA: Diagnosis not present

## 2020-07-06 DIAGNOSIS — L728 Other follicular cysts of the skin and subcutaneous tissue: Secondary | ICD-10-CM | POA: Diagnosis not present

## 2020-07-06 DIAGNOSIS — L820 Inflamed seborrheic keratosis: Secondary | ICD-10-CM | POA: Diagnosis not present

## 2020-10-25 DIAGNOSIS — Z13220 Encounter for screening for lipoid disorders: Secondary | ICD-10-CM | POA: Diagnosis not present

## 2020-10-25 DIAGNOSIS — Z0000 Encounter for general adult medical examination without abnormal findings: Secondary | ICD-10-CM | POA: Diagnosis not present

## 2020-10-25 DIAGNOSIS — Z23 Encounter for immunization: Secondary | ICD-10-CM | POA: Diagnosis not present

## 2020-10-25 DIAGNOSIS — Z131 Encounter for screening for diabetes mellitus: Secondary | ICD-10-CM | POA: Diagnosis not present

## 2020-11-07 DIAGNOSIS — M25511 Pain in right shoulder: Secondary | ICD-10-CM | POA: Diagnosis not present

## 2020-11-07 DIAGNOSIS — M25522 Pain in left elbow: Secondary | ICD-10-CM | POA: Diagnosis not present

## 2020-12-20 DIAGNOSIS — M25511 Pain in right shoulder: Secondary | ICD-10-CM | POA: Diagnosis not present

## 2020-12-20 DIAGNOSIS — R200 Anesthesia of skin: Secondary | ICD-10-CM | POA: Diagnosis not present

## 2020-12-20 DIAGNOSIS — M7702 Medial epicondylitis, left elbow: Secondary | ICD-10-CM | POA: Diagnosis not present

## 2021-01-09 DIAGNOSIS — Z23 Encounter for immunization: Secondary | ICD-10-CM | POA: Diagnosis not present

## 2021-01-17 DIAGNOSIS — R200 Anesthesia of skin: Secondary | ICD-10-CM | POA: Diagnosis not present

## 2021-01-17 DIAGNOSIS — M25522 Pain in left elbow: Secondary | ICD-10-CM | POA: Diagnosis not present

## 2021-01-25 DIAGNOSIS — M5412 Radiculopathy, cervical region: Secondary | ICD-10-CM | POA: Diagnosis not present

## 2021-01-31 DIAGNOSIS — M5412 Radiculopathy, cervical region: Secondary | ICD-10-CM | POA: Diagnosis not present

## 2021-02-02 DIAGNOSIS — M5412 Radiculopathy, cervical region: Secondary | ICD-10-CM | POA: Diagnosis not present

## 2021-02-14 DIAGNOSIS — M5412 Radiculopathy, cervical region: Secondary | ICD-10-CM | POA: Diagnosis not present

## 2021-02-16 DIAGNOSIS — Z6828 Body mass index (BMI) 28.0-28.9, adult: Secondary | ICD-10-CM | POA: Diagnosis not present

## 2021-02-16 DIAGNOSIS — Z1231 Encounter for screening mammogram for malignant neoplasm of breast: Secondary | ICD-10-CM | POA: Diagnosis not present

## 2021-02-16 DIAGNOSIS — Z01419 Encounter for gynecological examination (general) (routine) without abnormal findings: Secondary | ICD-10-CM | POA: Diagnosis not present

## 2021-05-12 DIAGNOSIS — H9202 Otalgia, left ear: Secondary | ICD-10-CM | POA: Diagnosis not present

## 2021-05-16 DIAGNOSIS — H52223 Regular astigmatism, bilateral: Secondary | ICD-10-CM | POA: Diagnosis not present

## 2021-05-16 DIAGNOSIS — H524 Presbyopia: Secondary | ICD-10-CM | POA: Diagnosis not present

## 2021-05-16 DIAGNOSIS — H5211 Myopia, right eye: Secondary | ICD-10-CM | POA: Diagnosis not present

## 2021-05-19 DIAGNOSIS — R202 Paresthesia of skin: Secondary | ICD-10-CM | POA: Diagnosis not present

## 2021-05-19 DIAGNOSIS — H9202 Otalgia, left ear: Secondary | ICD-10-CM | POA: Diagnosis not present

## 2021-06-07 ENCOUNTER — Encounter: Payer: Self-pay | Admitting: Neurology

## 2021-06-07 DIAGNOSIS — Z713 Dietary counseling and surveillance: Secondary | ICD-10-CM | POA: Diagnosis not present

## 2021-08-10 ENCOUNTER — Ambulatory Visit: Payer: BLUE CROSS/BLUE SHIELD | Admitting: Neurology

## 2021-09-01 DIAGNOSIS — Z713 Dietary counseling and surveillance: Secondary | ICD-10-CM | POA: Diagnosis not present

## 2021-10-26 DIAGNOSIS — Z131 Encounter for screening for diabetes mellitus: Secondary | ICD-10-CM | POA: Diagnosis not present

## 2021-10-26 DIAGNOSIS — Z13220 Encounter for screening for lipoid disorders: Secondary | ICD-10-CM | POA: Diagnosis not present

## 2021-10-26 DIAGNOSIS — R002 Palpitations: Secondary | ICD-10-CM | POA: Diagnosis not present

## 2021-10-26 DIAGNOSIS — Z0000 Encounter for general adult medical examination without abnormal findings: Secondary | ICD-10-CM | POA: Diagnosis not present

## 2021-11-24 DIAGNOSIS — Z713 Dietary counseling and surveillance: Secondary | ICD-10-CM | POA: Diagnosis not present

## 2022-02-13 DIAGNOSIS — Z713 Dietary counseling and surveillance: Secondary | ICD-10-CM | POA: Diagnosis not present

## 2022-03-14 DIAGNOSIS — Z01419 Encounter for gynecological examination (general) (routine) without abnormal findings: Secondary | ICD-10-CM | POA: Diagnosis not present

## 2022-03-14 DIAGNOSIS — Z6827 Body mass index (BMI) 27.0-27.9, adult: Secondary | ICD-10-CM | POA: Diagnosis not present

## 2022-03-14 DIAGNOSIS — Z124 Encounter for screening for malignant neoplasm of cervix: Secondary | ICD-10-CM | POA: Diagnosis not present

## 2022-03-14 DIAGNOSIS — Z1231 Encounter for screening mammogram for malignant neoplasm of breast: Secondary | ICD-10-CM | POA: Diagnosis not present

## 2024-01-31 ENCOUNTER — Other Ambulatory Visit: Payer: Self-pay | Admitting: Family Medicine

## 2024-01-31 DIAGNOSIS — R1011 Right upper quadrant pain: Secondary | ICD-10-CM
